# Patient Record
Sex: Male | Born: 1995 | Race: White | Hispanic: No | Marital: Single | Smoking: Never smoker
Health system: Southern US, Community
[De-identification: ages and names within clinical notes are randomized; demographics above are authoritative.]

## PROBLEM LIST (undated history)

## (undated) DIAGNOSIS — F909 Attention-deficit hyperactivity disorder, unspecified type: Secondary | ICD-10-CM

---
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
## 2017-02-16 DIAGNOSIS — B079 Viral wart, unspecified: Secondary | ICD-10-CM | POA: Diagnosis not present

## 2017-03-28 DIAGNOSIS — Z0183 Encounter for blood typing: Secondary | ICD-10-CM | POA: Diagnosis not present

## 2017-06-18 IMAGING — CR DG TOE 5TH 2+V*R*
3 series · 3 of 3 positions shown · non-contrast
Comparison: None.

CLINICAL DATA: Hit right fifth toe against door, with pain. Initial
encounter.

EXAM:
RIGHT FIFTH TOE

[t toes ap right]
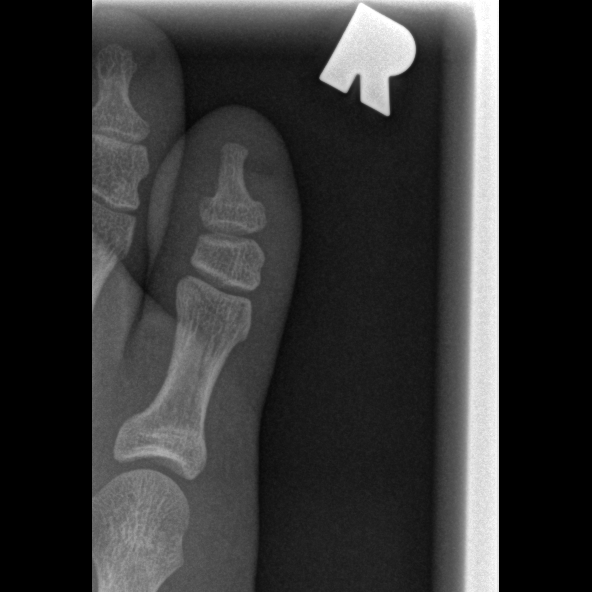

[t toes oblique right]
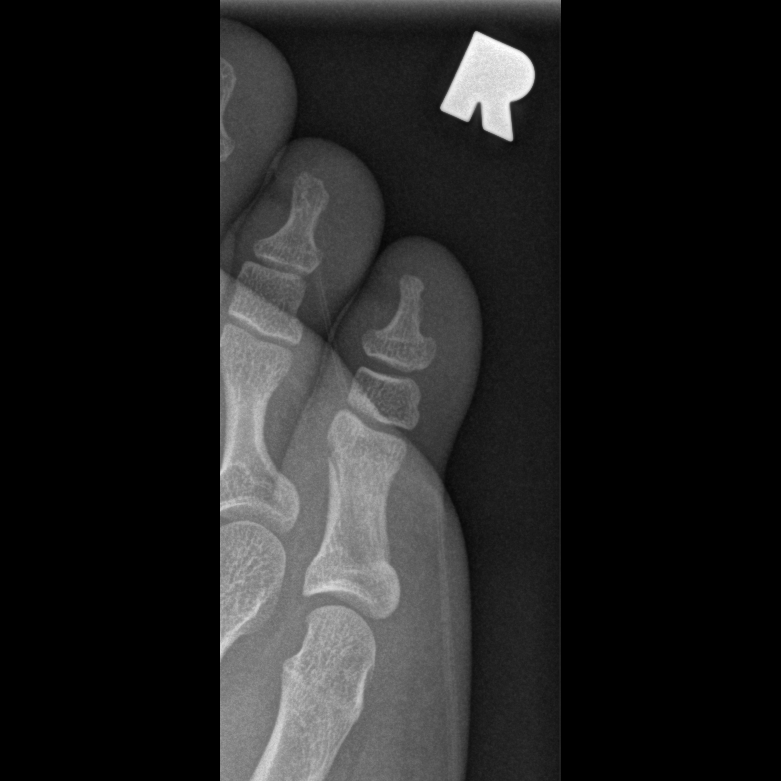

[t toes lateral right]
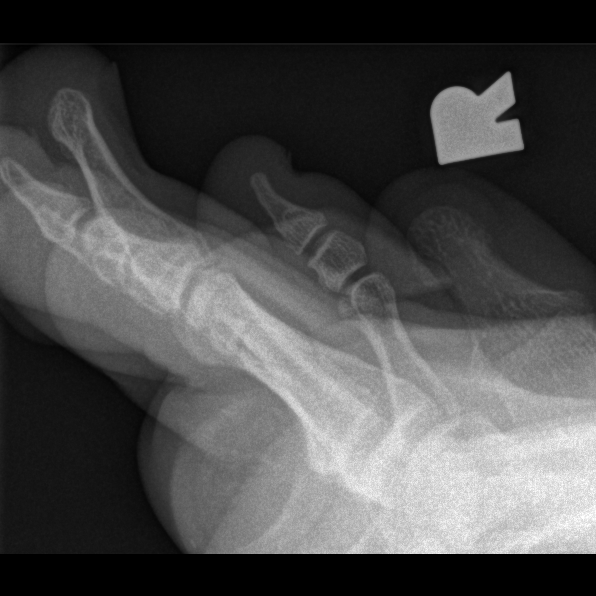

[3 of 3 positions shown; findings below may reference images not displayed]

FINDINGS: There is a minimally displaced oblique fracture through the fifth
proximal phalanx, with suggestion of proximal intra-articular
extension. Mild overlying soft tissue swelling is noted. No
additional fractures are seen.
IMPRESSION: Minimally displaced oblique fracture through the fifth proximal
phalanx, with suggestion of proximal intra-articular extension.

## 2017-07-04 DIAGNOSIS — B078 Other viral warts: Secondary | ICD-10-CM | POA: Diagnosis not present

## 2017-07-21 DIAGNOSIS — B078 Other viral warts: Secondary | ICD-10-CM | POA: Diagnosis not present

## 2017-08-08 DIAGNOSIS — B078 Other viral warts: Secondary | ICD-10-CM | POA: Diagnosis not present

## 2018-07-07 DIAGNOSIS — Z23 Encounter for immunization: Secondary | ICD-10-CM | POA: Diagnosis not present

## 2018-07-07 DIAGNOSIS — Z79899 Other long term (current) drug therapy: Secondary | ICD-10-CM | POA: Diagnosis not present

## 2020-06-27 DIAGNOSIS — M531 Cervicobrachial syndrome: Secondary | ICD-10-CM | POA: Diagnosis not present

## 2020-06-27 DIAGNOSIS — M9901 Segmental and somatic dysfunction of cervical region: Secondary | ICD-10-CM | POA: Diagnosis not present

## 2020-06-27 DIAGNOSIS — M9902 Segmental and somatic dysfunction of thoracic region: Secondary | ICD-10-CM | POA: Diagnosis not present

## 2020-06-27 DIAGNOSIS — M791 Myalgia, unspecified site: Secondary | ICD-10-CM | POA: Diagnosis not present

## 2020-07-22 DIAGNOSIS — U071 COVID-19: Secondary | ICD-10-CM | POA: Diagnosis not present

## 2020-07-24 ENCOUNTER — Other Ambulatory Visit: Payer: Self-pay

## 2020-07-24 DIAGNOSIS — F341 Dysthymic disorder: Secondary | ICD-10-CM | POA: Diagnosis not present

## 2020-07-24 DIAGNOSIS — F902 Attention-deficit hyperactivity disorder, combined type: Secondary | ICD-10-CM | POA: Diagnosis not present

## 2020-08-15 DIAGNOSIS — F341 Dysthymic disorder: Secondary | ICD-10-CM | POA: Diagnosis not present

## 2020-08-15 DIAGNOSIS — F902 Attention-deficit hyperactivity disorder, combined type: Secondary | ICD-10-CM | POA: Diagnosis not present

## 2020-08-29 DIAGNOSIS — F902 Attention-deficit hyperactivity disorder, combined type: Secondary | ICD-10-CM | POA: Diagnosis not present

## 2020-08-29 DIAGNOSIS — F341 Dysthymic disorder: Secondary | ICD-10-CM | POA: Diagnosis not present

## 2020-09-12 DIAGNOSIS — F902 Attention-deficit hyperactivity disorder, combined type: Secondary | ICD-10-CM | POA: Diagnosis not present

## 2020-09-12 DIAGNOSIS — F341 Dysthymic disorder: Secondary | ICD-10-CM | POA: Diagnosis not present

## 2020-09-26 DIAGNOSIS — F341 Dysthymic disorder: Secondary | ICD-10-CM | POA: Diagnosis not present

## 2020-09-26 DIAGNOSIS — F902 Attention-deficit hyperactivity disorder, combined type: Secondary | ICD-10-CM | POA: Diagnosis not present

## 2020-09-30 DIAGNOSIS — F900 Attention-deficit hyperactivity disorder, predominantly inattentive type: Secondary | ICD-10-CM | POA: Diagnosis not present

## 2020-10-10 DIAGNOSIS — F341 Dysthymic disorder: Secondary | ICD-10-CM | POA: Diagnosis not present

## 2020-10-10 DIAGNOSIS — F902 Attention-deficit hyperactivity disorder, combined type: Secondary | ICD-10-CM | POA: Diagnosis not present

## 2020-10-24 DIAGNOSIS — F341 Dysthymic disorder: Secondary | ICD-10-CM | POA: Diagnosis not present

## 2020-10-24 DIAGNOSIS — F902 Attention-deficit hyperactivity disorder, combined type: Secondary | ICD-10-CM | POA: Diagnosis not present

## 2020-11-07 DIAGNOSIS — F341 Dysthymic disorder: Secondary | ICD-10-CM | POA: Diagnosis not present

## 2020-11-07 DIAGNOSIS — F902 Attention-deficit hyperactivity disorder, combined type: Secondary | ICD-10-CM | POA: Diagnosis not present

## 2021-07-24 DIAGNOSIS — F341 Dysthymic disorder: Secondary | ICD-10-CM | POA: Diagnosis not present

## 2021-07-24 DIAGNOSIS — F902 Attention-deficit hyperactivity disorder, combined type: Secondary | ICD-10-CM | POA: Diagnosis not present

## 2021-08-14 DIAGNOSIS — F341 Dysthymic disorder: Secondary | ICD-10-CM | POA: Diagnosis not present

## 2021-08-14 DIAGNOSIS — F902 Attention-deficit hyperactivity disorder, combined type: Secondary | ICD-10-CM | POA: Diagnosis not present

## 2021-08-28 DIAGNOSIS — F902 Attention-deficit hyperactivity disorder, combined type: Secondary | ICD-10-CM | POA: Diagnosis not present

## 2021-08-28 DIAGNOSIS — F341 Dysthymic disorder: Secondary | ICD-10-CM | POA: Diagnosis not present

## 2021-09-11 DIAGNOSIS — F341 Dysthymic disorder: Secondary | ICD-10-CM | POA: Diagnosis not present

## 2021-09-11 DIAGNOSIS — F902 Attention-deficit hyperactivity disorder, combined type: Secondary | ICD-10-CM | POA: Diagnosis not present

## 2021-09-25 DIAGNOSIS — F341 Dysthymic disorder: Secondary | ICD-10-CM | POA: Diagnosis not present

## 2021-09-25 DIAGNOSIS — F902 Attention-deficit hyperactivity disorder, combined type: Secondary | ICD-10-CM | POA: Diagnosis not present

## 2021-10-09 DIAGNOSIS — F341 Dysthymic disorder: Secondary | ICD-10-CM | POA: Diagnosis not present

## 2021-10-09 DIAGNOSIS — F902 Attention-deficit hyperactivity disorder, combined type: Secondary | ICD-10-CM | POA: Diagnosis not present

## 2022-01-11 DIAGNOSIS — F902 Attention-deficit hyperactivity disorder, combined type: Secondary | ICD-10-CM | POA: Diagnosis not present

## 2022-01-11 DIAGNOSIS — F341 Dysthymic disorder: Secondary | ICD-10-CM | POA: Diagnosis not present

## 2022-02-08 DIAGNOSIS — F902 Attention-deficit hyperactivity disorder, combined type: Secondary | ICD-10-CM | POA: Diagnosis not present

## 2022-02-08 DIAGNOSIS — F341 Dysthymic disorder: Secondary | ICD-10-CM | POA: Diagnosis not present

## 2022-02-22 DIAGNOSIS — F902 Attention-deficit hyperactivity disorder, combined type: Secondary | ICD-10-CM | POA: Diagnosis not present

## 2022-02-22 DIAGNOSIS — F341 Dysthymic disorder: Secondary | ICD-10-CM | POA: Diagnosis not present

## 2022-03-08 DIAGNOSIS — Z23 Encounter for immunization: Secondary | ICD-10-CM | POA: Diagnosis not present

## 2022-09-07 ENCOUNTER — Other Ambulatory Visit: Payer: Self-pay | Admitting: Family Medicine

## 2022-09-07 ENCOUNTER — Ambulatory Visit (INDEPENDENT_AMBULATORY_CARE_PROVIDER_SITE_OTHER): Payer: BC Managed Care – PPO

## 2022-09-07 DIAGNOSIS — R519 Headache, unspecified: Secondary | ICD-10-CM | POA: Diagnosis not present

## 2022-09-07 DIAGNOSIS — G4451 Hemicrania continua: Secondary | ICD-10-CM

## 2022-09-07 DIAGNOSIS — R202 Paresthesia of skin: Secondary | ICD-10-CM | POA: Diagnosis not present

## 2022-09-07 DIAGNOSIS — G4489 Other headache syndrome: Secondary | ICD-10-CM | POA: Diagnosis not present

## 2022-09-09 DIAGNOSIS — R519 Headache, unspecified: Secondary | ICD-10-CM | POA: Diagnosis not present

## 2022-11-04 ENCOUNTER — Encounter: Payer: Self-pay | Admitting: Neurology

## 2022-11-09 ENCOUNTER — Ambulatory Visit: Payer: BC Managed Care – PPO | Admitting: Neurology

## 2022-11-09 ENCOUNTER — Encounter: Payer: Self-pay | Admitting: Neurology

## 2022-11-09 VITALS — BP 132/83 | HR 77 | Ht 72.0 in | Wt 230.5 lb

## 2022-11-09 DIAGNOSIS — G4451 Hemicrania continua: Secondary | ICD-10-CM | POA: Diagnosis not present

## 2022-11-09 MED ORDER — INDOMETHACIN 25 MG PO CAPS: 25.0000 mg | ORAL_CAPSULE | Freq: Three times a day (TID) | ORAL | 0 refills | Status: DC

## 2022-11-09 NOTE — Progress Notes (Signed)
[facility]  PATIENT: Isaiah Hunter. Isaiah Hunter DOB: [birth year]  REQUESTING CLINICIAN: Henrine Screws, MD HISTORY FROM: Patient  REASON FOR VISIT: Left sided headaches   HISTORICAL  CHIEF COMPLAINT:  Chief Complaint  Patient presents with   New Patient (Initial Visit)    Rm 15, alone Heaches whole left side ongoing since march constant worse at time     HISTORY OF PRESENT ILLNESS:  This is a 27 year old gentleman with no reported past medical history who is presenting with complaint of left-sided headache.  He reports since March he has been having this constant left-sided headache, behind the left eye in the temporal region, V1 distribution.  Described pain as sharp pain (like he just inhaled cold air).  Denies any photophobia or phonophobia associated with a headache, no migrainous features.  Patient reports headache usually is constant at a low level but he will have a period of high intensity.  He has not tried anything for the pain.  Denies any family history of migraines. He had a recent CT Head which was within normal limits.  He reports that he does work with a  lot of chemicals, mainly bleach without using a face/nose mask.    Headache History and Characteristics: Onset: March Location: Left side, left temple, behind left eye Quality:  sharp pain Intensity:1-10 /10. (Varies), currently at a 1.  Duration: Since March intermittent  Migrainous Features: None Aura: No  History of brain injury or tumor: No  Family history: None  Motion sickness: no Cardiac history: no  OTC: tylenol, codeine Caffeine: Sleep:  Mood/ Stress:   Prior prophylaxis: Propranolol: No  Verapamil:No TCA: No Topamax: No Depakote: No Effexor: No Cymbalta: No Neurontin:No  Prior abortives: Triptan: No Anti-emetic: No Steroids: No Ergotamine suppository: No    OTHER MEDICAL CONDITIONS: None reported    REVIEW OF SYSTEMS: Full 14 system review of systems  performed and negative with exception of: As noted in the HPI  ALLERGIES: No Known Allergies  HOME MEDICATIONS: Outpatient Medications Prior to Visit  Medication Sig Dispense Refill   lisdexamfetamine (VYVANSE) 25 MG capsule Take 70 mg by mouth daily.     No facility-administered medications prior to visit.    PAST MEDICAL HISTORY: Past Medical History:  Diagnosis Date   ADHD (attention deficit hyperactivity disorder)     PAST SURGICAL HISTORY: Past Surgical History:  Procedure Laterality Date   left thumb pins  2014    FAMILY HISTORY: Family History  Problem Relation Age of Onset   Heart disease Father     SOCIAL HISTORY: Social History   Socioeconomic History   Marital status: Single    Spouse name: Not on file   Number of children: Not on file   Years of education: Not on file   Highest education level: Not on file  Occupational History   Not on file  Tobacco Use   Smoking status: Never   Smokeless tobacco: Never  Substance and Sexual Activity   Alcohol use: Yes    Alcohol/week: 6.0 standard drinks of alcohol    Types: 5 Glasses of wine, 1 Shots of liquor per week   Drug use: No   Sexual activity: Yes    Birth control/protection: Condom  Other Topics Concern   Not on file  Social History Narrative   Not on file   Social Determinants of Health   Financial Resource Strain: Not on file  Food Insecurity: Not on file  Transportation Needs: Not on file  Physical  Activity: Not on file  Stress: Not on file  Social Connections: Not on file  Intimate Partner Violence: Not on file    PHYSICAL EXAM  GENERAL EXAM/CONSTITUTIONAL: Vitals:  Vitals:   11/09/22 1035  BP: 132/83  Pulse: 77  Weight: 230 lb 8 oz (104.6 kg)  Height: 6' (1.829 m)   Body mass index is 31.26 kg/m. Wt Readings from Last 3 Encounters:  11/09/22 230 lb 8 oz (104.6 kg)  01/26/16 183 lb 9.6 oz (83.3 kg) (90 %, Z= 1.26)*  01/01/16 173 lb (78.5 kg) (84 %, Z= 0.98)*   * Growth  percentiles are based on CDC (Boys, 2-20 Years) data.   Patient is in no distress; well developed, nourished and groomed; neck is supple  EYES: Pupils round and reactive to light, Visual fields full to confrontation, Extraocular movements intacts,   MUSCULOSKELETAL: Gait, strength, tone, movements noted in Neurologic exam below  NEUROLOGIC: MENTAL STATUS:      No data to display         awake, alert, oriented to person, place and time recent and remote memory intact normal attention and concentration language fluent, comprehension intact, naming intact fund of knowledge appropriate  CRANIAL NERVE:  2nd, 3rd, 4th, 6th - pupils equal and reactive to light, visual fields full to confrontation, extraocular muscles intact, no nystagmus 5th - facial sensation symmetric 7th - facial strength symmetric 8th - hearing intact 9th - palate elevates symmetrically, uvula midline 11th - shoulder shrug symmetric 12th - tongue protrusion midline  MOTOR:  normal bulk and tone, full strength in the BUE, BLE  SENSORY:  normal and symmetric to light touch  COORDINATION:  finger-nose-finger, fine finger movements norma  GAIT/STATION:  normal     DIAGNOSTIC DATA (LABS, IMAGING, TESTING) - I reviewed patient records, labs, notes, testing and imaging myself where available.  No results found for: "WBC", "HGB", "HCT", "MCV", "PLT" No results found for: "NA", "K", "CL", "CO2", "GLUCOSE", "BUN", "CREATININE", "CALCIUM", "PROT", "ALBUMIN", "AST", "ALT", "ALKPHOS", "BILITOT", "GFRNONAA", "GFRAA" No results found for: "CHOL", "HDL", "LDLCALC", "LDLDIRECT", "TRIG", "CHOLHDL" No results found for: "HGBA1C" No results found for: "VITAMINB12" No results found for: "TSH"  Head CT 09/07/22 Normal head CT    ASSESSMENT AND PLAN  27 y.o. year old male with no reported past medical history who is presenting with constant left-sided headache that varied in intensity.  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  I will start him on low-dose indomethacin 25 mg 3 times daily which should be effective by definition.  If medication is not controlling the headaches then we will have to reconsider the diagnosis.  Advised patient to contact me if his pain is not resolved.  He voices understanding.  Continue to follow-up with PCP return sooner if worse   1. Hemicrania continua     Patient Instructions  Start with indomethacin 25 mg 3 times daily, please contact me after a week to discuss severity of headaches. By definition the headache should be responsive to the medicine. Continue follow-up PCP Return sooner if worse    No orders of the defined types were placed in this encounter.   Meds ordered this encounter  Medications   indomethacin (INDOCIN) 25 MG capsule    Sig: Take 1 capsule (25 mg total) by mouth 3 (three) times daily with meals.    Dispense:  90 capsule    Refill:  0    Return in about 6 months (around 05/11/2023).  Windell Norfolk, MD 11/09/2022, 11:37 AM  [facility] [address], [address] [place], [place] [address] [phone]

## 2022-11-09 NOTE — Patient Instructions (Addendum)
Start with indomethacin 25 mg 3 times daily, please contact me after a week to discuss severity of headaches. By definition the headache should be responsive to the medicine. Continue follow-up PCP Return sooner if worse

## 2022-12-08 ENCOUNTER — Other Ambulatory Visit: Payer: Self-pay | Admitting: Neurology

## 2022-12-08 NOTE — Telephone Encounter (Signed)
Call to patient to inform script was refilled for the indomethacin and he can take as needed up to 3 times a day with food. Patient verbalized understanding.

## 2022-12-08 NOTE — Telephone Encounter (Signed)
Call to patient but mom michelle answered. Per Dr. Teresa Coombs last office note, wanted patient to call 1 week after appointment to determine if indomethacin was effective. Mom thinks that it has been but is going to have patient call me back to discuss.

## 2022-12-08 NOTE — Telephone Encounter (Signed)
PRN

## 2022-12-26 DIAGNOSIS — S40862A Insect bite (nonvenomous) of left upper arm, initial encounter: Secondary | ICD-10-CM | POA: Diagnosis not present

## 2022-12-26 DIAGNOSIS — W57XXXA Bitten or stung by nonvenomous insect and other nonvenomous arthropods, initial encounter: Secondary | ICD-10-CM | POA: Diagnosis not present

## 2023-03-04 DIAGNOSIS — Z79899 Other long term (current) drug therapy: Secondary | ICD-10-CM | POA: Diagnosis not present

## 2023-03-04 DIAGNOSIS — F900 Attention-deficit hyperactivity disorder, predominantly inattentive type: Secondary | ICD-10-CM | POA: Diagnosis not present

## 2023-03-12 DIAGNOSIS — G44019 Episodic cluster headache, not intractable: Secondary | ICD-10-CM | POA: Diagnosis not present

## 2023-05-11 ENCOUNTER — Encounter: Payer: Self-pay | Admitting: Neurology

## 2023-05-11 ENCOUNTER — Ambulatory Visit: Payer: BC Managed Care – PPO | Admitting: Neurology

## 2023-05-13 DIAGNOSIS — Z6828 Body mass index (BMI) 28.0-28.9, adult: Secondary | ICD-10-CM | POA: Diagnosis not present

## 2023-05-13 DIAGNOSIS — R112 Nausea with vomiting, unspecified: Secondary | ICD-10-CM | POA: Diagnosis not present

## 2023-06-27 DIAGNOSIS — R4182 Altered mental status, unspecified: Secondary | ICD-10-CM | POA: Diagnosis not present

## 2023-06-27 DIAGNOSIS — R410 Disorientation, unspecified: Secondary | ICD-10-CM | POA: Diagnosis not present

## 2024-05-23 ENCOUNTER — Other Ambulatory Visit: Payer: Self-pay | Admitting: Family Medicine

## 2024-05-23 DIAGNOSIS — R002 Palpitations: Secondary | ICD-10-CM

## 2024-05-29 ENCOUNTER — Ambulatory Visit (INDEPENDENT_AMBULATORY_CARE_PROVIDER_SITE_OTHER): Payer: Self-pay

## 2024-05-29 DIAGNOSIS — R002 Palpitations: Secondary | ICD-10-CM
